# Patient Record
Sex: Female | Born: 1980 | Race: White | Hispanic: No | Marital: Married | Smoking: Never smoker
Health system: Southern US, Community
[De-identification: ages and names within clinical notes are randomized; demographics above are authoritative.]

## PROBLEM LIST (undated history)

## (undated) HISTORY — PX: TONSILLECTOMY: SUR1361

---

## 2013-02-07 ENCOUNTER — Emergency Department (HOSPITAL_BASED_OUTPATIENT_CLINIC_OR_DEPARTMENT_OTHER)
Admission: EM | Admit: 2013-02-07 | Discharge: 2013-02-07 | Disposition: A | Discharge status: Home / Self Care | Payer: BC Managed Care – PPO | Attending: Emergency Medicine | Admitting: Emergency Medicine

## 2013-02-07 ENCOUNTER — Encounter (HOSPITAL_BASED_OUTPATIENT_CLINIC_OR_DEPARTMENT_OTHER): Payer: Self-pay | Admitting: Emergency Medicine

## 2013-02-07 DIAGNOSIS — S61319A Laceration without foreign body of unspecified finger with damage to nail, initial encounter: Secondary | ICD-10-CM

## 2013-02-07 DIAGNOSIS — Y929 Unspecified place or not applicable: Secondary | ICD-10-CM | POA: Insufficient documentation

## 2013-02-07 DIAGNOSIS — Y939 Activity, unspecified: Secondary | ICD-10-CM | POA: Insufficient documentation

## 2013-02-07 DIAGNOSIS — S61209A Unspecified open wound of unspecified finger without damage to nail, initial encounter: Secondary | ICD-10-CM | POA: Insufficient documentation

## 2013-02-07 DIAGNOSIS — W278XXA Contact with other nonpowered hand tool, initial encounter: Secondary | ICD-10-CM | POA: Insufficient documentation

## 2013-02-07 MED ORDER — IBUPROFEN 800 MG PO TABS: 800.0000 mg | ORAL_TABLET | Freq: Three times a day (TID) | ORAL | Status: DC

## 2013-02-07 NOTE — Discharge Instructions (Signed)
After the steri-strips fall off, clean wound according to instructions. Return here as/if needed.  Wound Care Wound care helps prevent pain and infection.  You may need a tetanus shot if:  You cannot remember when you had your last tetanus shot.  You have never had a tetanus shot.  The injury broke your skin. If you need a tetanus shot and you choose not to have one, you may get tetanus. Sickness from tetanus can be serious. HOME CARE   Only take medicine as told by your doctor.  Clean the wound daily with mild soap and water.  Change any bandages (dressings) as told by your doctor.  Put medicated cream and a bandage on the wound as told by your doctor.  Change the bandage if it gets wet, dirty, or starts to smell.  Take showers. Do not take baths, swim, or do anything that puts your wound under water.  Rest and raise (elevate) the wound until the pain and puffiness (swelling) are better.  Keep all doctor visits as told. GET HELP RIGHT AWAY IF:   Yellowish-white fluid (pus) comes from the wound.  Medicine does not lessen your pain.  There is a red streak going away from the wound.  You have a fever. MAKE SURE YOU:   Understand these instructions.  Will watch your condition.  Will get help right away if you are not doing well or get worse. Document Released: 10/12/2007 Document Revised: 03/27/2011 Document Reviewed: 05/08/2010 [facility] Patient Information 2014 [place], [place].

## 2013-02-07 NOTE — ED Provider Notes (Signed)
CSN: [id number]     Arrival date & time 02/07/13  1400 History   First MD Initiated Contact with Patient 02/07/13 1412     Chief Complaint  Patient presents with  . Laceration   (Consider location/radiation/quality/duration/timing/severity/associated sxs/prior Treatment) Patient is a 33 y.o. female presenting with skin laceration. The history is provided by the patient. No language interpreter was used.  Laceration Location:  Finger Finger laceration location:  L thumb Length (cm):  2 Laceration mechanism:  Knife Foreign body present:  No foreign bodies Ineffective treatments:  None tried Tetanus status:  Up to date   History reviewed. No pertinent past medical history. History reviewed. No pertinent past surgical history. History reviewed. No pertinent family history. History  Substance Use Topics  . Smoking status: Never Smoker   . Smokeless tobacco: Not on file  . Alcohol Use: Not on file   OB History   Grav Para Term Preterm Abortions TAB SAB Ect Mult Living                 Review of Systems  Constitutional: Negative.   Musculoskeletal: Negative.   Skin:       C/O laceration.  Neurological: Negative.     Allergies  Hydrocodone  Home Medications  No current outpatient prescriptions on file. BP 127/77  Pulse 72  Temp(Src) 97.8 F (36.6 C) (Oral)  Resp 18  SpO2 99%  LMP 01/31/2013 Physical Exam  Constitutional: She is oriented to person, place, and time. She appears well-developed and well-nourished.  Neck: Normal range of motion.  Pulmonary/Chest: Effort normal.  Neurological: She is alert and oriented to person, place, and time.  Skin: Skin is warm and dry.  Linear laceration through nail of thumb without avulsion to any portion of nail. No extension into skin surrounding nail.      ED Course  Procedures (including critical care time) Labs Review Labs Reviewed - No data to display Imaging Review No results found.  EKG Interpretation   None      Digital block performed for patient comfort.  MDM  No diagnosis found. 1. Finger laceration 2. Nail injury   Exam supports linear laceration to nail with likely nailbed involvement but without avulsion to any part of nail. Feel no suture repair required.  Arnoldo HookerShari A Gurdeep Keesey, PA-C 02/07/13 1508

## 2013-02-07 NOTE — ED Notes (Signed)
EDP at bedside  

## 2013-02-07 NOTE — ED Notes (Signed)
Pt amb to triage laughing and smiling in nad. Pt reports cutting her left thumb with a paper cutter just pta, dsd in place at triage with no bleeding noted on dressing. Pt states td is utd.

## 2013-02-10 NOTE — ED Provider Notes (Signed)
Medical screening examination/treatment/procedure(s) were conducted as a shared visit with non-physician practitioner(s) and myself.  I personally evaluated the patient during the encounter.  EKG Interpretation   None       Patient presents with laceration involving fingernail and nailbed. There is no subungual hematoma.  Bleeding is controlled.  Given that the laceration involves the nail and nailbed, have opted to leave female in place and use it to approximate the underlying nailbed laceration.  Shon Batonourtney F Horton, MD 02/10/13 [phone]

## 2013-02-25 ENCOUNTER — Emergency Department (HOSPITAL_BASED_OUTPATIENT_CLINIC_OR_DEPARTMENT_OTHER)
Admission: EM | Admit: 2013-02-25 | Discharge: 2013-02-25 | Disposition: A | Discharge status: Home / Self Care | Payer: BC Managed Care – PPO | Attending: Emergency Medicine | Admitting: Emergency Medicine

## 2013-02-25 ENCOUNTER — Encounter (HOSPITAL_BASED_OUTPATIENT_CLINIC_OR_DEPARTMENT_OTHER): Payer: Self-pay | Admitting: Emergency Medicine

## 2013-02-25 DIAGNOSIS — Z791 Long term (current) use of non-steroidal anti-inflammatories (NSAID): Secondary | ICD-10-CM | POA: Insufficient documentation

## 2013-02-25 DIAGNOSIS — Z792 Long term (current) use of antibiotics: Secondary | ICD-10-CM | POA: Insufficient documentation

## 2013-02-25 DIAGNOSIS — L03319 Cellulitis of trunk, unspecified: Principal | ICD-10-CM

## 2013-02-25 DIAGNOSIS — M542 Cervicalgia: Secondary | ICD-10-CM | POA: Insufficient documentation

## 2013-02-25 DIAGNOSIS — L02219 Cutaneous abscess of trunk, unspecified: Secondary | ICD-10-CM | POA: Insufficient documentation

## 2013-02-25 DIAGNOSIS — L0390 Cellulitis, unspecified: Secondary | ICD-10-CM

## 2013-02-25 MED ORDER — CLINDAMYCIN HCL 300 MG PO CAPS: 300.0000 mg | ORAL_CAPSULE | Freq: Four times a day (QID) | ORAL | Status: DC

## 2013-02-25 MED ORDER — OXYCODONE-ACETAMINOPHEN 5-325 MG PO TABS: 2.0000 | ORAL_TABLET | ORAL | Status: DC | PRN

## 2013-02-25 MED ORDER — CLINDAMYCIN HCL 300 MG PO CAPS: 300.0000 mg | ORAL_CAPSULE | Freq: Four times a day (QID) | ORAL | Status: AC

## 2013-02-25 NOTE — ED Notes (Signed)
Pt reports 10 days ago noticed a bump in right ear canal began having a lot of pain and swelling over the weekend. States now having pain and swelling on side of her face. Has been using hot compressed in attempt to relieve pain and swelling

## 2013-02-25 NOTE — ED Provider Notes (Signed)
CSN: [id number]     Arrival date & time 02/25/13  1018 History   First MD Initiated Contact with Patient 02/25/13 1039     Chief Complaint  Patient presents with  . right ear pain and swelling      (Consider location/radiation/quality/duration/timing/severity/associated sxs/prior Treatment) HPI Comments: Patient presents with redness and pain in her right ear. She states that a few days ago she noticed a small bump at the outset of her ear canal. She states since that time her ear is gotten more swollen and red. She's also noticed some pain going down to the right side of her face. She denies a fevers or chills. She denies any headache or dizziness. She denies any drainage from her ear. She had a history of one prior abscess that was I&D'd on her lower back. Her tetanus is up-to-date.   History reviewed. No pertinent past medical history. Past Surgical History  Procedure Laterality Date  . Tonsillectomy     History reviewed. No pertinent family history. History  Substance Use Topics  . Smoking status: Never Smoker   . Smokeless tobacco: Not on file  . Alcohol Use: No   OB History   Grav Para Term Preterm Abortions TAB SAB Ect Mult Living                 Review of Systems  Constitutional: Negative for fever.  HENT: Negative for facial swelling.   Gastrointestinal: Negative for nausea and vomiting.  Musculoskeletal: Positive for neck pain.  Skin: Positive for wound.  Neurological: Negative for dizziness and headaches.  Psychiatric/Behavioral: Negative for confusion.      Allergies  Hydrocodone  Home Medications   Current Outpatient Rx  Name  Route  Sig  Dispense  Refill  . clindamycin (CLEOCIN) 300 MG capsule   Oral   Take 1 capsule (300 mg total) by mouth 4 (four) times daily. X 10 days   40 capsule   0   . ibuprofen (ADVIL,MOTRIN) 800 MG tablet   Oral   Take 1 tablet (800 mg total) by mouth 3 (three) times daily.   21 tablet   0    BP 134/89  Pulse 94   Temp(Src) 98 F (36.7 C) (Oral)  Resp 16  SpO2 100%  LMP 01/31/2013 Physical Exam  Constitutional: She is oriented to person, place, and time. She appears well-developed and well-nourished.  HENT:  Mouth/Throat: Oropharynx is clear and moist.  Patient has a small scabbed area just outside the right ear canal. There some swelling around this area but no discrete abscesses palpated. There's no induration or fluctuance. There is surrounding erythema to the pinna. There is no elevation of the ear.  No drainage.  Neck: Normal range of motion. Neck supple.  Cardiovascular: Normal rate.   Pulmonary/Chest: Effort normal.  Neurological: She is alert and oriented to person, place, and time.  Skin: Skin is warm and dry.    ED Course  Procedures (including critical care time) Labs Review Labs Reviewed - No data to display Imaging Review No results found.  EKG Interpretation   None       MDM   Final diagnoses:  Cellulitis    Patient presents with an area of cellulitis her right ear. I don't feel any drainable abscess. I started on clindamycin. Her tetanus is up-to-date. I encouraged her to followup with her primary care physician within 2 days for recheck or return here if her symptoms worsen.    Rolan BuccoMelanie Jakye Mullens, MD  02/25/13 1527 

## 2013-02-25 NOTE — Discharge Instructions (Signed)
Cellulitis Cellulitis is an infection of the skin and the tissue beneath it. The infected area is usually red and tender. Cellulitis occurs most often in the arms and lower legs.  CAUSES  Cellulitis is caused by bacteria that enter the skin through cracks or cuts in the skin. The most common types of bacteria that cause cellulitis are Staphylococcus and Streptococcus. SYMPTOMS   Redness and warmth.  Swelling.  Tenderness or pain.  Fever. DIAGNOSIS  Your caregiver can usually determine what is wrong based on a physical exam. Blood tests may also be done. TREATMENT  Treatment usually involves taking an antibiotic medicine. HOME CARE INSTRUCTIONS   Take your antibiotics as directed. Finish them even if you start to feel better.  Keep the infected arm or leg elevated to reduce swelling.  Apply a warm cloth to the affected area up to 4 times per day to relieve pain.  Only take over-the-counter or prescription medicines for pain, discomfort, or fever as directed by your caregiver.  Keep all follow-up appointments as directed by your caregiver. SEEK MEDICAL CARE IF:   You notice red streaks coming from the infected area.  Your red area gets larger or turns dark in color.  Your bone or joint underneath the infected area becomes painful after the skin has healed.  Your infection returns in the same area or another area.  You notice a swollen bump in the infected area.  You develop new symptoms. SEEK IMMEDIATE MEDICAL CARE IF:   You have a fever.  You feel very sleepy.  You develop vomiting or diarrhea.  You have a general ill feeling (malaise) with muscle aches and pains. MAKE SURE YOU:   Understand these instructions.  Will watch your condition.  Will get help right away if you are not doing well or get worse. Document Released: 10/12/2004 Document Revised: 07/04/2011 Document Reviewed: 03/20/2011 [employer] Patient Information 2014 [employer].  

## 2015-08-24 ENCOUNTER — Emergency Department (HOSPITAL_BASED_OUTPATIENT_CLINIC_OR_DEPARTMENT_OTHER): Payer: BLUE CROSS/BLUE SHIELD

## 2015-08-24 ENCOUNTER — Emergency Department (HOSPITAL_BASED_OUTPATIENT_CLINIC_OR_DEPARTMENT_OTHER)
Admission: EM | Admit: 2015-08-24 | Discharge: 2015-08-24 | Disposition: A | Discharge status: Home / Self Care | Payer: BLUE CROSS/BLUE SHIELD | Attending: Emergency Medicine | Admitting: Emergency Medicine

## 2015-08-24 ENCOUNTER — Encounter (HOSPITAL_BASED_OUTPATIENT_CLINIC_OR_DEPARTMENT_OTHER): Payer: Self-pay | Admitting: *Deleted

## 2015-08-24 DIAGNOSIS — N23 Unspecified renal colic: Secondary | ICD-10-CM | POA: Insufficient documentation

## 2015-08-24 DIAGNOSIS — R8271 Bacteriuria: Secondary | ICD-10-CM | POA: Diagnosis not present

## 2015-08-24 DIAGNOSIS — R319 Hematuria, unspecified: Secondary | ICD-10-CM

## 2015-08-24 DIAGNOSIS — R1032 Left lower quadrant pain: Secondary | ICD-10-CM

## 2015-08-24 DIAGNOSIS — R109 Unspecified abdominal pain: Secondary | ICD-10-CM | POA: Diagnosis present

## 2015-08-24 LAB — URINE MICROSCOPIC-ADD ON

## 2015-08-24 LAB — CBC WITH DIFFERENTIAL/PLATELET
BASOS PCT: 0 %
Basophils Absolute: 0.1 10*3/uL (ref 0.0–0.1)
EOS PCT: 1 %
Eosinophils Absolute: 0.2 10*3/uL (ref 0.0–0.7)
HCT: 40.1 % (ref 36.0–46.0)
HEMOGLOBIN: 14 g/dL (ref 12.0–15.0)
Lymphocytes Relative: 24 %
Lymphs Abs: 2.8 10*3/uL (ref 0.7–4.0)
MCH: 31.3 pg (ref 26.0–34.0)
MCHC: 34.9 g/dL (ref 30.0–36.0)
MCV: 89.5 fL (ref 78.0–100.0)
MONOS PCT: 5 %
Monocytes Absolute: 0.6 10*3/uL (ref 0.1–1.0)
Neutro Abs: 8.3 10*3/uL — ABNORMAL HIGH (ref 1.7–7.7)
Neutrophils Relative %: 70 %
Platelets: 287 10*3/uL (ref 150–400)
RBC: 4.48 MIL/uL (ref 3.87–5.11)
RDW: 11.8 % (ref 11.5–15.5)
WBC: 11.9 10*3/uL — ABNORMAL HIGH (ref 4.0–10.5)

## 2015-08-24 LAB — URINALYSIS, ROUTINE W REFLEX MICROSCOPIC
BILIRUBIN URINE: NEGATIVE
Glucose, UA: NEGATIVE mg/dL
KETONES UR: NEGATIVE mg/dL
Nitrite: NEGATIVE
PROTEIN: NEGATIVE mg/dL
Specific Gravity, Urine: 1.024 (ref 1.005–1.030)
pH: 5.5 (ref 5.0–8.0)

## 2015-08-24 LAB — BASIC METABOLIC PANEL
Anion gap: 10 (ref 5–15)
BUN: 10 mg/dL (ref 6–20)
CHLORIDE: 107 mmol/L (ref 101–111)
CO2: 22 mmol/L (ref 22–32)
CREATININE: 0.71 mg/dL (ref 0.44–1.00)
Calcium: 9.5 mg/dL (ref 8.9–10.3)
GFR calc Af Amer: >60 mL/min (ref >60)
Glucose, Bld: 113 mg/dL — ABNORMAL HIGH (ref 65–99)
Potassium: 3.4 mmol/L — ABNORMAL LOW (ref 3.5–5.1)
SODIUM: 139 mmol/L (ref 135–145)

## 2015-08-24 LAB — PREGNANCY, URINE: PREG TEST UR: NEGATIVE

## 2015-08-24 MED ORDER — TAMSULOSIN HCL 0.4 MG PO CAPS
0.4000 mg | ORAL_CAPSULE | Freq: Once | ORAL | Status: AC
  Administered 2015-08-24: 0.4 mg via ORAL
  Filled 2015-08-24: qty 1

## 2015-08-24 MED ORDER — ONDANSETRON 4 MG PO TBDP: ORAL_TABLET | ORAL | 0 refills | Status: AC

## 2015-08-24 MED ORDER — ONDANSETRON HCL 4 MG/2ML IJ SOLN
INTRAMUSCULAR | Status: AC
  Administered 2015-08-24: 4 mg via INTRAVENOUS
  Filled 2015-08-24: qty 2

## 2015-08-24 MED ORDER — TAMSULOSIN HCL 0.4 MG PO CAPS: 0.4000 mg | ORAL_CAPSULE | Freq: Every day | ORAL | 0 refills | Status: AC

## 2015-08-24 MED ORDER — IBUPROFEN 800 MG PO TABS: 800.0000 mg | ORAL_TABLET | Freq: Three times a day (TID) | ORAL | 0 refills | Status: AC

## 2015-08-24 MED ORDER — MORPHINE SULFATE (PF) 4 MG/ML IV SOLN: 4.0000 mg | Freq: Once | INTRAVENOUS | Status: DC

## 2015-08-24 MED ORDER — OXYCODONE-ACETAMINOPHEN 5-325 MG PO TABS: 1.0000 | ORAL_TABLET | Freq: Four times a day (QID) | ORAL | 0 refills | Status: AC | PRN

## 2015-08-24 MED ORDER — CEFTRIAXONE SODIUM 1 G IJ SOLR
1.0000 g | Freq: Once | INTRAMUSCULAR | Status: AC
  Administered 2015-08-24: 1 g via INTRAVENOUS
  Filled 2015-08-24: qty 10

## 2015-08-24 MED ORDER — CEPHALEXIN 500 MG PO CAPS: 500.0000 mg | ORAL_CAPSULE | Freq: Three times a day (TID) | ORAL | 0 refills | Status: AC

## 2015-08-24 MED ORDER — SODIUM CHLORIDE 0.9 % IV BOLUS (SEPSIS)
1000.0000 mL | Freq: Once | INTRAVENOUS | Status: AC
  Administered 2015-08-24: 1000 mL via INTRAVENOUS

## 2015-08-24 MED ORDER — FENTANYL CITRATE (PF) 100 MCG/2ML IJ SOLN
INTRAMUSCULAR | Status: AC
  Administered 2015-08-24: 50 ug via INTRAVENOUS
  Filled 2015-08-24: qty 2

## 2015-08-24 MED ORDER — ONDANSETRON HCL 4 MG/2ML IJ SOLN
4.0000 mg | Freq: Once | INTRAMUSCULAR | Status: AC
  Administered 2015-08-24: 4 mg via INTRAVENOUS

## 2015-08-24 MED ORDER — OXYCODONE-ACETAMINOPHEN 5-325 MG PO TABS
1.0000 | ORAL_TABLET | Freq: Once | ORAL | Status: AC
  Administered 2015-08-24: 1 via ORAL
  Filled 2015-08-24: qty 1

## 2015-08-24 MED ORDER — ONDANSETRON HCL 4 MG/2ML IJ SOLN: 4.0000 mg | Freq: Once | INTRAMUSCULAR | Status: DC

## 2015-08-24 MED ORDER — KETOROLAC TROMETHAMINE 30 MG/ML IJ SOLN: 30.0000 mg | Freq: Once | INTRAMUSCULAR | Status: DC

## 2015-08-24 MED ORDER — KETOROLAC TROMETHAMINE 30 MG/ML IJ SOLN
30.0000 mg | Freq: Once | INTRAMUSCULAR | Status: AC
  Administered 2015-08-24: 30 mg via INTRAVENOUS

## 2015-08-24 MED ORDER — KETOROLAC TROMETHAMINE 30 MG/ML IJ SOLN
INTRAMUSCULAR | Status: AC
  Filled 2015-08-24: qty 1

## 2015-08-24 MED ORDER — FENTANYL CITRATE (PF) 100 MCG/2ML IJ SOLN
50.0000 ug | Freq: Once | INTRAMUSCULAR | Status: AC
  Administered 2015-08-24: 50 ug via INTRAVENOUS

## 2015-08-24 MED ORDER — FENTANYL CITRATE (PF) 100 MCG/2ML IJ SOLN
50.0000 ug | Freq: Once | INTRAMUSCULAR | Status: AC
Start: 2015-08-24 — End: 2015-08-24
  Administered 2015-08-24: 50 ug via INTRAVENOUS
  Filled 2015-08-24: qty 2

## 2015-08-24 MED FILL — ONDANSETRON ODT 4 MG TABLET: 4 | 2 days supply | Qty: 9 | Fill #0

## 2015-08-24 MED FILL — OXYCODONE/APAP 5-325: 5-325 | 3 days supply | Qty: 10 | Fill #0

## 2015-08-24 MED FILL — IBUPROFEN 800 MG TABLET: 800 | 10 days supply | Qty: 30 | Fill #0

## 2015-08-24 MED FILL — TAMSULOSIN HCL 0.4 MG CAP: 0.4 | 10 days supply | Qty: 10 | Fill #0

## 2015-08-24 MED FILL — CEPHALEXIN 500 MG CAPSULE: 500 | 7 days supply | Qty: 21 | Fill #0

## 2015-08-24 NOTE — Discharge Instructions (Signed)
Take motrin for pain.   Stay hydrated.   Take keflex three times daily for a week.   Take zofran for nausea.   Take percocet for severe pain. Do NOT drive with it.   See your doctor and urologist.   Return to ER if you have severe pain, fever, vomiting, trouble urinating.

## 2015-08-24 NOTE — ED Notes (Signed)
Dr. Yao into room, at BS 

## 2015-08-24 NOTE — ED Notes (Signed)
MD at bedside. 

## 2015-08-24 NOTE — ED Provider Notes (Signed)
[facility]   CSN: [id number] Arrival date & time: 08/24/15  1308  First Provider Contact:  First MD Initiated Contact with Patient 08/24/15 [phone]        History   Chief Complaint Chief Complaint  Patient presents with  . Abdominal Pain    HPI Melanie Cox is a 35 y.o. female otherwise healthy here with Left flank pain. Left flank pain that is sharp that is acute onset at 2:30 AM. It woke her up from sleep. Has vomited several times, bilious. Denies fevers. Has been urinating frequently but denies blood in urine. Denies vaginal discharge or bleeding. Denies hx of kidney stones.   The history is provided by the patient.    History reviewed. No pertinent past medical history.  There are no active problems to display for this patient.   Past Surgical History:  Procedure Laterality Date  . TONSILLECTOMY      OB History    No data available       Home Medications    Prior to Admission medications   Medication Sig Start Date End Date Taking? Authorizing Provider  clindamycin (CLEOCIN) 300 MG capsule Take 1 capsule (300 mg total) by mouth 4 (four) times daily. X 10 days 02/25/13   Rolan Bucco, MD  ibuprofen (ADVIL,MOTRIN) 800 MG tablet Take 1 tablet (800 mg total) by mouth 3 (three) times daily. 02/07/13   Elpidio Anis, PA-C    Family History History reviewed. No pertinent family history.  Social History Social History  Substance Use Topics  . Smoking status: Never Smoker  . Smokeless tobacco: Never Used  . Alcohol use No     Allergies   Hydrocodone   Review of Systems Review of Systems  Gastrointestinal: Positive for abdominal pain.  All other systems reviewed and are negative.    Physical Exam Updated Vital Signs BP 107/70 (BP Location: Left Arm)   Pulse 61   Temp 98.3 F (36.8 C) (Oral)   Resp 18   Ht 5\' 10"  (1.778 m)   Wt 190 lb (86.2 kg)   SpO2 100%   BMI 27.26 kg/m   Physical Exam  Constitutional: She is  oriented to person, place, and time. She appears well-nourished.  Slightly uncomfortable   HENT:  Head: Normocephalic.  Eyes: Conjunctivae and EOM are normal. Pupils are equal, round, and reactive to light.  Neck: Normal range of motion. Neck supple.  Cardiovascular: Normal rate, regular rhythm and normal heart sounds.   Pulmonary/Chest: Effort normal and breath sounds normal.  Abdominal: Soft. Bowel sounds are normal.  Mild L CVAT, mild LLQ tenderness   Musculoskeletal: Normal range of motion.  Neurological: She is alert and oriented to person, place, and time.  Skin: Skin is warm.  Psychiatric: She has a normal mood and affect.  Nursing note and vitals reviewed.    ED Treatments / Results  Labs (all labs ordered are listed, but only abnormal results are displayed) Labs Reviewed  CBC WITH DIFFERENTIAL/PLATELET - Abnormal; Notable for the following:       Result Value   WBC 11.9 (*)    Neutro Abs 8.3 (*)    All other components within normal limits  BASIC METABOLIC PANEL - Abnormal; Notable for the following:    Potassium 3.4 (*)    Glucose, Bld 113 (*)    All other components within normal limits  URINALYSIS, ROUTINE W REFLEX MICROSCOPIC (NOT AT [facility]) - Abnormal; Notable for the following:    APPearance  CLOUDY (*)    Hgb urine dipstick LARGE (*)    Leukocytes, UA SMALL (*)    All other components within normal limits  URINE MICROSCOPIC-ADD ON - Abnormal; Notable for the following:    Squamous Epithelial / LPF 6-30 (*)    Bacteria, UA MANY (*)    All other components within normal limits  PREGNANCY, URINE    EKG  EKG Interpretation None       Radiology Ct Renal Stone Study  Result Date: 08/24/2015 CLINICAL DATA:  Acute onset of left lower quadrant abdominal pain and hematuria. Nausea and vomiting. Initial encounter. EXAM: CT ABDOMEN AND PELVIS WITHOUT CONTRAST TECHNIQUE: Multidetector CT imaging of the abdomen and pelvis was performed following the standard  protocol without IV contrast. COMPARISON:  None. FINDINGS: The visualized lung bases are clear. The liver and spleen are unremarkable in appearance. The gallbladder is within normal limits. The pancreas and adrenal glands are unremarkable. There is minimal left-sided hydronephrosis, with prominence of the left ureter. An obstructing 2 mm stone is noted distally at the left vesicoureteral junction, along the left side of the base of the bladder. Mild nonspecific increased attenuation is noted within the renal medulla bilaterally. A tiny 2 mm stone is suggested at the upper pole of the left kidney. No perinephric stranding is seen. No free fluid is identified. The small bowel is unremarkable in appearance. The stomach is within normal limits. No acute vascular abnormalities are seen. The appendix is not well characterized; there is no evidence for appendicitis. The colon is largely decompressed and is grossly unremarkable in appearance. The bladder is relatively decompressed and otherwise unremarkable. The uterus is grossly unremarkable in appearance. The left ovary is grossly unremarkable in appearance. No inguinal lymphadenopathy is seen. No acute osseous abnormalities are identified. IMPRESSION: 1. Minimal left-sided hydronephrosis, with obstructing 2 mm stone noted distally at the left vesicoureteral junction along the left side of the base of the bladder. 2. Tiny 2 mm nonobstructing stone suggested at the upper pole of the left kidney. Electronically Signed   By: Roanna Raider M.D.   On: 08/24/2015 06:29    Procedures Procedures (including critical care time)  Medications Ordered in ED Medications  ondansetron (ZOFRAN) injection 4 mg (4 mg Intravenous Refused 08/24/15 0730)  morphine 4 MG/ML injection 4 mg (4 mg Intravenous Refused 08/24/15 0730)  fentaNYL (SUBLIMAZE) injection 50 mcg (50 mcg Intravenous Given 08/24/15 0534)  ondansetron (ZOFRAN) injection 4 mg (4 mg Intravenous Given 08/24/15 0534)    fentaNYL (SUBLIMAZE) injection 50 mcg (50 mcg Intravenous Given 08/24/15 0549)  ketorolac (TORADOL) 30 MG/ML injection 30 mg (30 mg Intravenous Given 08/24/15 0558)  tamsulosin (FLOMAX) capsule 0.4 mg (0.4 mg Oral Given 08/24/15 0640)  sodium chloride 0.9 % bolus 1,000 mL (0 mLs Intravenous Stopped 08/24/15 0820)  cefTRIAXone (ROCEPHIN) 1 g in dextrose 5 % 50 mL IVPB (0 g Intravenous Stopped 08/24/15 0820)  oxyCODONE-acetaminophen (PERCOCET/ROXICET) 5-325 MG per tablet 1 tablet (1 tablet Oral Given 08/24/15 0819)     Initial Impression / Assessment and Plan / ED Course  I have reviewed the triage vital signs and the nursing notes.  Pertinent labs & imaging results that were available during my care of the patient were reviewed by me and considered in my medical decision making (see chart for details).  Clinical Course    Inari Shin is a 35 y.o. female here with L CVAT, LLQ pain. Likely renal colic. Will get labs, UA, CT renal stone.  8:29 AM CT renal stone showed 2 mm stone with mild hydro. UA + blood with some bacteria. Has no dysuria but given that she has a kidney stone, will give rocephin empirically. Tolerated PO, pain controlled with percocet. Will dc home with motrin, percocet, flomax, keflex, urology follow up.   Final Clinical Impressions(s) / ED Diagnoses   Final diagnoses:  LLQ pain  Hematuria    New Prescriptions New Prescriptions   No medications on file     Charlynne Panderavid Hsienta Amarius Toto, MD 08/24/15 [phone]

## 2015-08-24 NOTE — ED Notes (Signed)
Pt directed to pharmacy to pick up prescriptions. Pt's husband present with her to drive. Note for work given.

## 2015-08-24 NOTE — ED Notes (Signed)
To CT on stretcher

## 2015-08-24 NOTE — ED Triage Notes (Signed)
C/o LLQ pain, onset 02300, also nv, vomited x3, describes as bile, also reports urinary frequency and retention(unable to produce urine/scant), (denies: fever, diarrhea, vaginal sx, bleeding, hematuria), [facility] restless, cold, clammy, writhing.

## 2016-01-19 ENCOUNTER — Other Ambulatory Visit: Payer: Self-pay | Admitting: Family Medicine

## 2016-01-19 ENCOUNTER — Other Ambulatory Visit (HOSPITAL_COMMUNITY)
Admission: RE | Admit: 2016-01-19 | Discharge: 2016-01-19 | Disposition: A | Discharge status: Home / Self Care | Payer: BLUE CROSS/BLUE SHIELD | Source: Ambulatory Visit | Attending: Family Medicine | Admitting: Family Medicine

## 2016-01-19 DIAGNOSIS — Z124 Encounter for screening for malignant neoplasm of cervix: Secondary | ICD-10-CM | POA: Insufficient documentation

## 2016-01-19 DIAGNOSIS — Z1151 Encounter for screening for human papillomavirus (HPV): Secondary | ICD-10-CM | POA: Insufficient documentation

## 2016-01-21 LAB — CYTOLOGY - PAP
Diagnosis: NEGATIVE
HPV (WINDOPATH): NOT DETECTED

## 2017-01-23 IMAGING — CT CT RENAL STONE PROTOCOL
2 of 4 series · 16 of 46 positions shown, 18 images · non-contrast
Comparison: None.

CLINICAL DATA: Acute onset of left lower quadrant abdominal pain
and hematuria. Nausea and vomiting. Initial encounter.

EXAM:
CT ABDOMEN AND PELVIS WITHOUT CONTRAST
TECHNIQUE: Multidetector CT imaging of the abdomen and pelvis was performed
following the standard protocol without IV contrast.

[Series 2: axial st · axial · 0.90mm/px · z∈[-516,-76]mm · 13 of 98 slices shown, 15 images]
[im 5/98  soft-tissue]
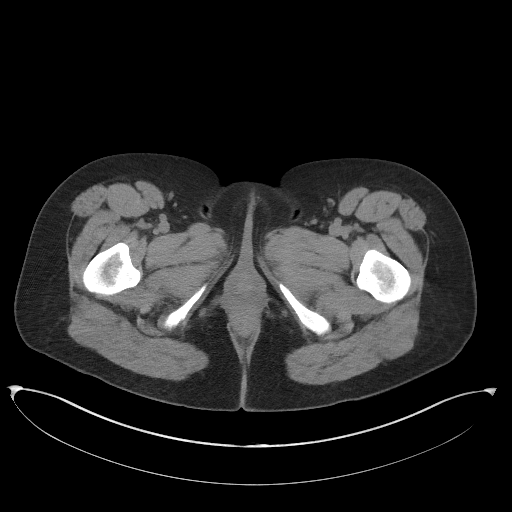
[im 5/98  bone]
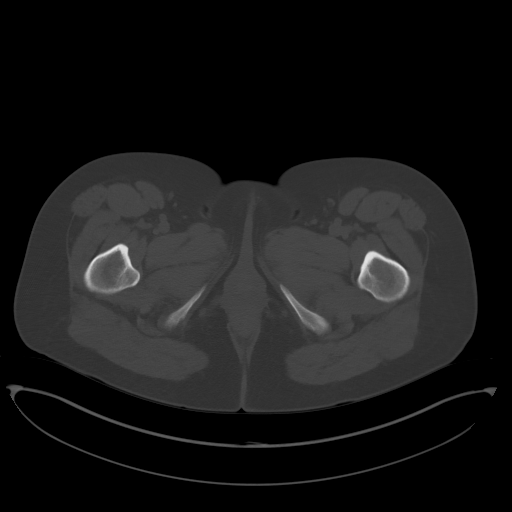
[im 13/98  soft-tissue]
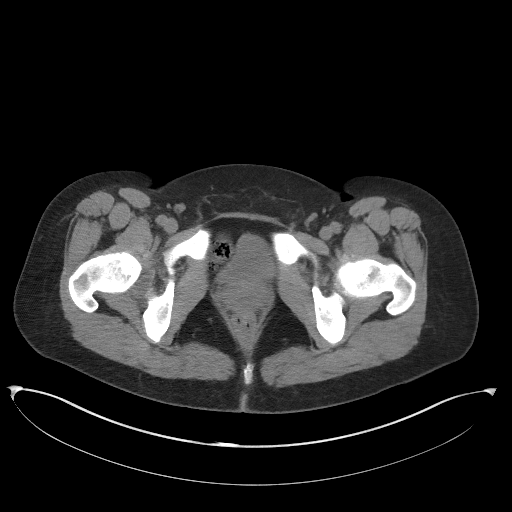
[im 21/98  soft-tissue]
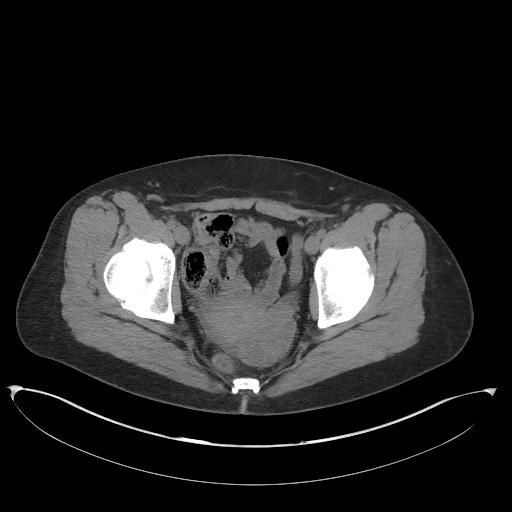
[im 29/98  soft-tissue]
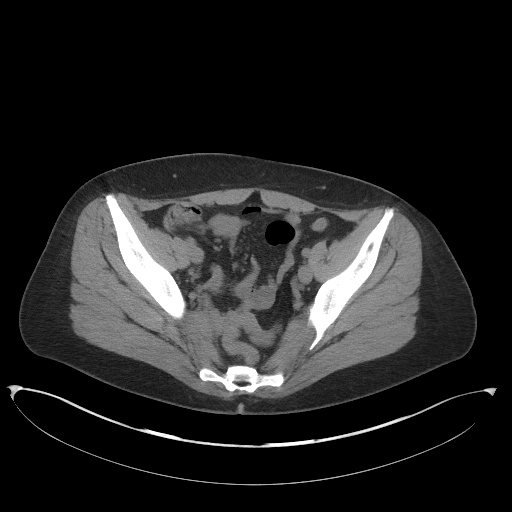
[im 33/98  soft-tissue]
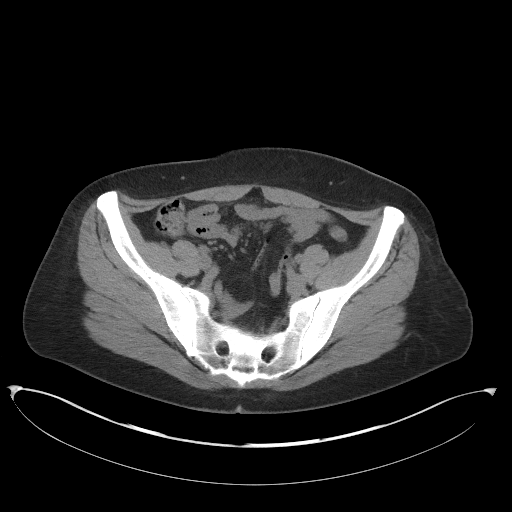
[im 41/98  soft-tissue]
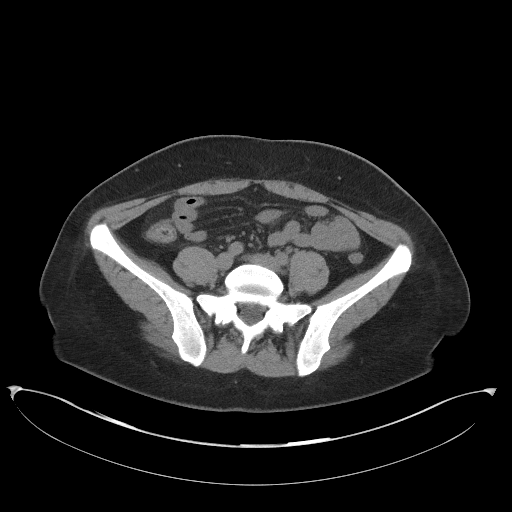
[im 49/98  soft-tissue]
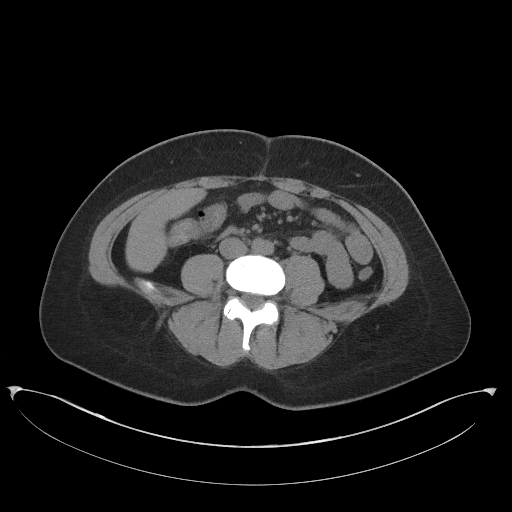
[im 57/98  soft-tissue]
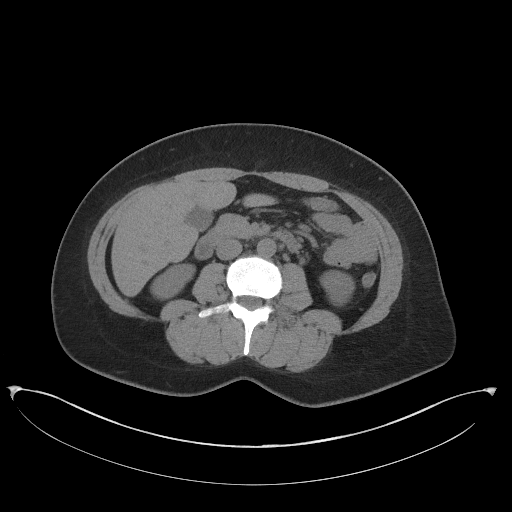
[im 65/98  soft-tissue]
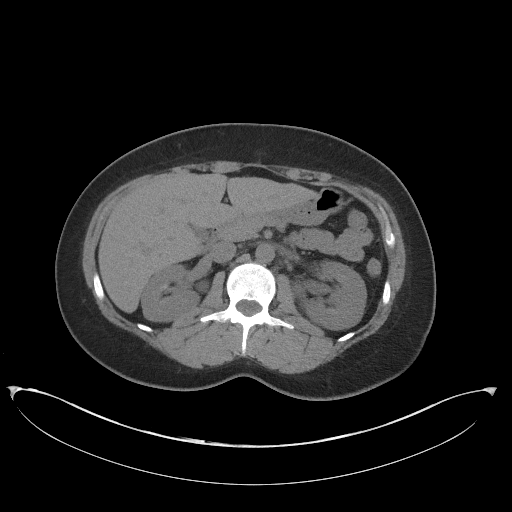
[im 65/98  bone]
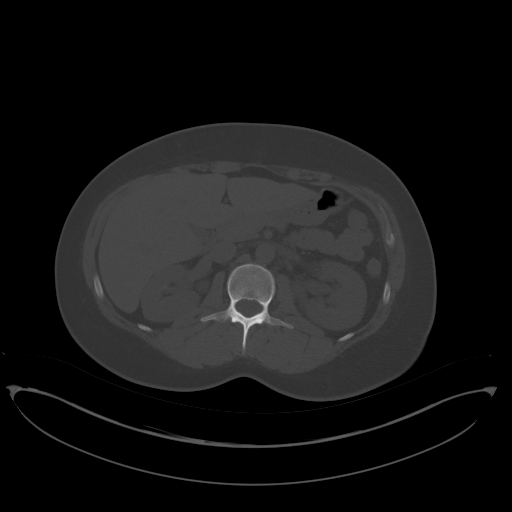
[im 69/98  soft-tissue]
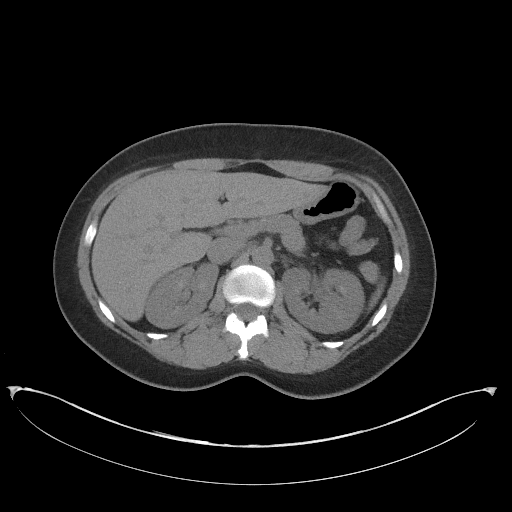
[im 77/98  soft-tissue]
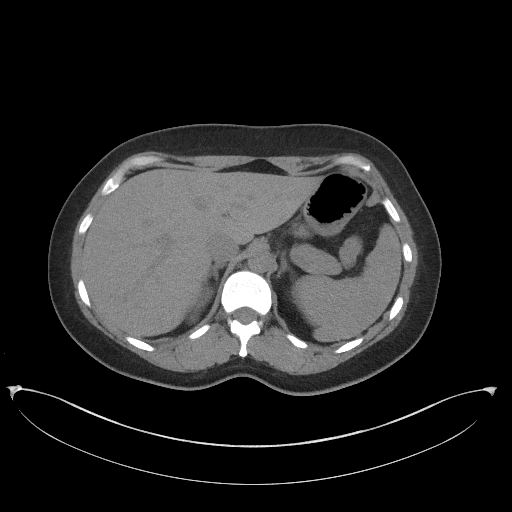
[im 85/98  soft-tissue]
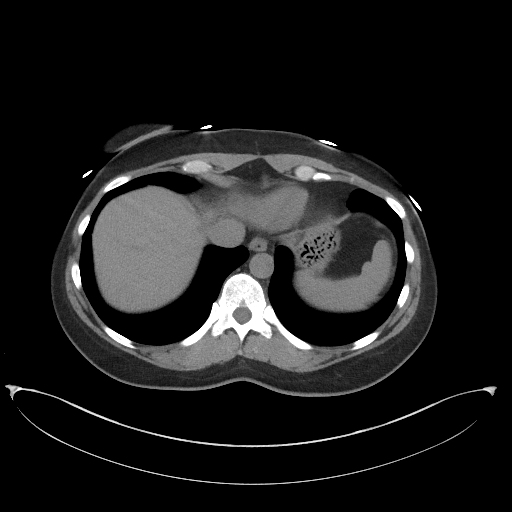
[im 93/98  soft-tissue]
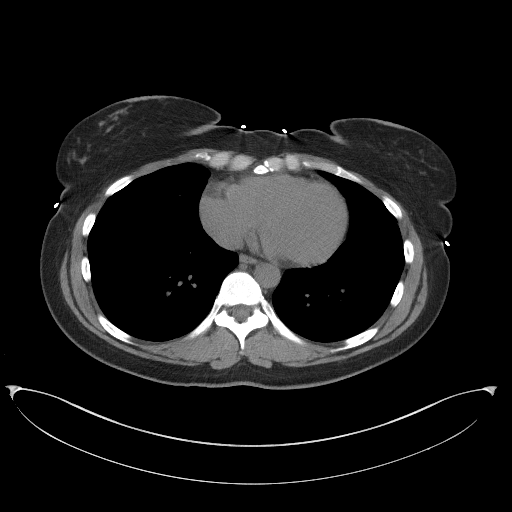

[Series 5: coronal st · coronal · 0.77mm/px · 3 of 75 slices shown]
[im 25/75  soft-tissue]
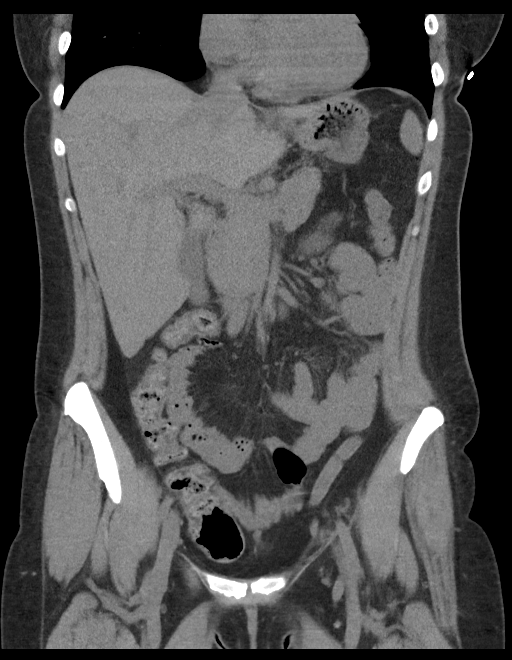
[im 33/75  soft-tissue]
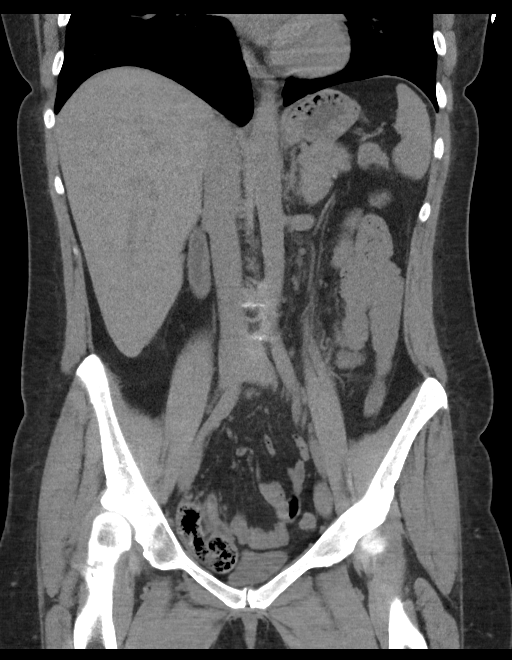
[im 42/75  soft-tissue]
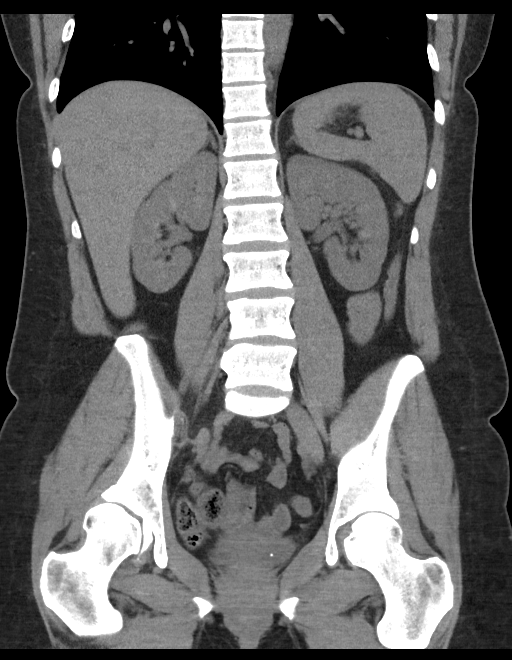

[16 of 46 positions shown; findings below may reference images not displayed]

FINDINGS: The visualized lung bases are clear.

The liver and spleen are unremarkable in appearance. The gallbladder
is within normal limits. The pancreas and adrenal glands are
unremarkable.

There is minimal left-sided hydronephrosis, with prominence of the
left ureter. An obstructing 2 mm stone is noted distally at the left
vesicoureteral junction, along the left side of the base of the
bladder. Mild nonspecific increased attenuation is noted within the
renal medulla bilaterally. A tiny 2 mm stone is suggested at the
upper pole of the left kidney. No perinephric stranding is seen.

No free fluid is identified. The small bowel is unremarkable in
appearance. The stomach is within normal limits. No acute vascular
abnormalities are seen.

The appendix is not well characterized; there is no evidence for
appendicitis. The colon is largely decompressed and is grossly
unremarkable in appearance.

The bladder is relatively decompressed and otherwise unremarkable.
The uterus is grossly unremarkable in appearance. The left ovary is
grossly unremarkable in appearance. No inguinal lymphadenopathy is
seen.

No acute osseous abnormalities are identified.
IMPRESSION: 1. Minimal left-sided hydronephrosis, with obstructing 2 mm stone
noted distally at the left vesicoureteral junction along the left
side of the base of the bladder.
2. Tiny 2 mm nonobstructing stone suggested at the upper pole of the
left kidney.
